# Patient Record
Sex: Female | Born: 1965 | Hispanic: Yes | Marital: Single | Smoking: Never smoker
Health system: Southern US, Community
[De-identification: ages and names within clinical notes are randomized; demographics above are authoritative.]

---

## 2005-03-29 ENCOUNTER — Ambulatory Visit: Payer: Self-pay

## 2008-04-28 ENCOUNTER — Ambulatory Visit: Payer: Self-pay

## 2009-03-22 ENCOUNTER — Ambulatory Visit: Payer: Self-pay | Admitting: Family Medicine

## 2009-03-29 ENCOUNTER — Ambulatory Visit: Payer: Self-pay | Admitting: Internal Medicine

## 2009-04-26 ENCOUNTER — Ambulatory Visit: Payer: Self-pay | Admitting: Family Medicine

## 2009-07-01 ENCOUNTER — Ambulatory Visit: Payer: Self-pay | Admitting: Family Medicine

## 2010-02-27 IMAGING — NM NUCLEAR MEDICINE HEPATOHBILIARY INCLUDE GB
2 series · 12 of 12 positions shown · non-contrast
Comparison: none

REASON FOR EXAM: nausea  vomiting   epigastric pain
COMMENTS:

[Series 1000: gallbladder dynamic (results) · 4.80mm/px · 6 of 60 frames shown]
[frame 6/60]
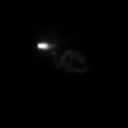
[frame 16/60]
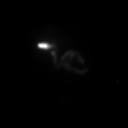
[frame 26/60]
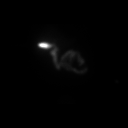
[frame 36/60]
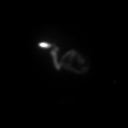
[frame 46/60]
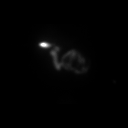
[frame 56/60]
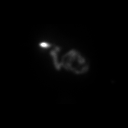

[Series 1000: gallbladder dynamic · 4.80mm/px · 6 of 60 frames shown]
[frame 6/60]
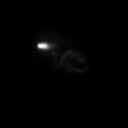
[frame 16/60]
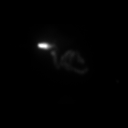
[frame 26/60]
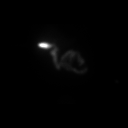
[frame 36/60]
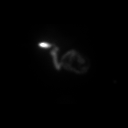
[frame 46/60]
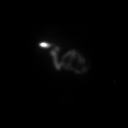
[frame 56/60]
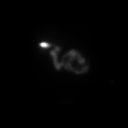

[12 of 12 positions shown; findings below may reference images not displayed]

PROCEDURE:     NM  - NM HEPATO WITH GB EJECT FRACTION  - March 29, 2009  [DATE]

RESULT:     Following intravenous administration of 8.2 mCi technetium 99m
Choletec, there is noted prompt visualization of tracer activity in the
liver at 3 minutes. At 50 minutes tracer activity is visualized in the
gallbladder, common duct and proximal small bowel.

The gallbladder ejection fraction at 30 minutes measures 60% which is within
the normal limits.
IMPRESSION: 1. Normal hepatobiliary scan.
2. The gallbladder ejection fraction measures 60% which is within the normal
range.

## 2010-11-01 ENCOUNTER — Ambulatory Visit: Payer: Self-pay

## 2010-11-08 ENCOUNTER — Ambulatory Visit: Payer: Self-pay

## 2011-05-15 ENCOUNTER — Ambulatory Visit: Payer: Self-pay

## 2011-11-15 ENCOUNTER — Ambulatory Visit: Payer: Self-pay

## 2013-01-14 ENCOUNTER — Ambulatory Visit: Payer: Self-pay | Admitting: Family Medicine

## 2015-03-31 ENCOUNTER — Ambulatory Visit
Admission: RE | Admit: 2015-03-31 | Discharge: 2015-03-31 | Disposition: A | Discharge status: Home / Self Care | Payer: Self-pay | Source: Ambulatory Visit | Attending: Oncology | Admitting: Oncology

## 2015-03-31 ENCOUNTER — Ambulatory Visit: Payer: Self-pay | Attending: Oncology

## 2015-03-31 VITALS — BP 115/77 | HR 87 | Temp 97.6°F | Resp 18 | Ht 61.42 in | Wt 194.7 lb

## 2015-03-31 DIAGNOSIS — Z0000 Encounter for general adult medical examination without abnormal findings: Secondary | ICD-10-CM

## 2015-03-31 NOTE — Progress Notes (Signed)
Subjective:     Patient ID: Anita Barnes, female   DOB: [birth year], 49 y.o.   MRN: [id number]  HPI   Review of Systems     Objective:   Physical Exam  Pulmonary/Chest: Right breast exhibits no inverted nipple, no mass, no nipple discharge, no skin change and no tenderness. Left breast exhibits no inverted nipple, no mass, no nipple discharge, no skin change and no tenderness. Breasts are symmetrical.  Tattoo upper left chest  Genitourinary: No labial fusion. There is no rash, tenderness, lesion or injury on the right labia. There is no rash, tenderness, lesion or injury on the left labia. Uterus is not deviated, not enlarged, not fixed and not tender. Cervix exhibits no motion tenderness, no discharge and no friability. Right adnexum displays no mass, no tenderness and no fullness. Left adnexum displays no mass, no tenderness and no fullness. No erythema, tenderness or bleeding in the vagina. No foreign body around the vagina. No signs of injury around the vagina. No vaginal discharge found.  Atrophic cervical os       Assessment:     49 year old hispanic patient presents for [facility] clinic visit.  Patient screened, and meets [facility] eligibility.  Patient does not have insurance, Medicare or Medicaid.  Handout given on Affordable Care Act.  CBE unremarkable.  Instructed patient on breast self-exam using teach back method    Plan:     Sent for bilateral screening mammogram.  Specimen collected for pap.  Jaqui Lakauitis interpreted exam.

## 2015-04-05 LAB — PAP LB AND HPV HIGH-RISK
HPV, high-risk: NEGATIVE
PAP SMEAR COMMENT: 0

## 2015-04-07 NOTE — Progress Notes (Signed)
Letter mailed to patient to notify of normal mammogram, and pap smear results.  Next pap smear due in 5 years due to negative HPV results.  Patient to return in one year for mammogram. Copy to [facility].

## 2016-06-07 ENCOUNTER — Ambulatory Visit
Admission: RE | Admit: 2016-06-07 | Discharge: 2016-06-07 | Disposition: A | Discharge status: Home / Self Care | Payer: Self-pay | Source: Ambulatory Visit | Attending: Oncology | Admitting: Oncology

## 2016-06-07 ENCOUNTER — Ambulatory Visit: Payer: Self-pay | Attending: Oncology

## 2016-06-07 VITALS — BP 111/76 | HR 76 | Temp 97.7°F | Ht 62.6 in | Wt 184.9 lb

## 2016-06-07 DIAGNOSIS — Z0000 Encounter for general adult medical examination without abnormal findings: Secondary | ICD-10-CM

## 2016-06-07 NOTE — Progress Notes (Signed)
Subjective:     Patient ID: Johnsie KindredRufina Sanchez Hernandez, female   DOB: 21-Jun-1966, 50 y.o.   MRN: [id number]  HPI   Review of Systems     Objective:   Physical Exam  Pulmonary/Chest: Right breast exhibits no inverted nipple, no mass, no nipple discharge, no skin change and no tenderness. Left breast exhibits no inverted nipple, no mass, no nipple discharge, no skin change and no tenderness. Breasts are symmetrical.  Tattoo left upper arm       Assessment:     50 year old hispanic [facility] Patient screened, and meets [facility] eligibility.  Patient does not have insurance, Medicare or Medicaid.  Handout given on Affordable Care Act.  Instructed patient on breast self-exam using teach back method.  CBE unremarkable.  No mass or lump palpated.  Jacqui Laukaitis interpreted exam. Patient had a negative /negative pap in 2016.    Plan:     Sent for bilateral screening mammogram.

## 2016-06-09 NOTE — Progress Notes (Signed)
Letter mailed from [facility] to notify of normal mammogram results.  Patient to return in one year for annual screening.  Copy to [facility]. 

## 2016-09-25 DIAGNOSIS — S82842A Displaced bimalleolar fracture of left lower leg, initial encounter for closed fracture: Secondary | ICD-10-CM | POA: Insufficient documentation

## 2017-08-15 ENCOUNTER — Encounter (INDEPENDENT_AMBULATORY_CARE_PROVIDER_SITE_OTHER): Payer: Self-pay

## 2017-08-15 ENCOUNTER — Other Ambulatory Visit: Payer: Self-pay

## 2017-08-15 ENCOUNTER — Ambulatory Visit: Payer: Self-pay | Attending: Oncology

## 2017-08-15 ENCOUNTER — Ambulatory Visit
Admission: RE | Admit: 2017-08-15 | Discharge: 2017-08-15 | Disposition: A | Discharge status: Home / Self Care | Payer: Self-pay | Source: Ambulatory Visit | Attending: Oncology | Admitting: Oncology

## 2017-08-15 VITALS — Ht 62.0 in | Wt 180.0 lb

## 2017-08-15 DIAGNOSIS — Z0000 Encounter for general adult medical examination without abnormal findings: Secondary | ICD-10-CM

## 2017-08-15 NOTE — Progress Notes (Signed)
Subjective:     Patient ID: Anita Barnes, female   DOB: [birth year], 52 y.o.   MRN: [id number]  HPI   Review of Systems     Objective:   Physical Exam  Pulmonary/Chest: Right breast exhibits no inverted nipple, no mass, no nipple discharge, no skin change and no tenderness. Left breast exhibits no inverted nipple, no mass, no nipple discharge, no skin change and no tenderness. Breasts are symmetrical.       Assessment:     52 year [facility] hispanic [facility]  Jaqui Laukaitis interpreted exam. Patient screened, and meets [facility] eligibility.  Patient does not have insurance, Medicare or Medicaid.  Handout given on Affordable Care Act. Instructed patient on breast self awareness using teach back method.  CBE unremarkable.  No mass or lump palpated.      Plan:     Sent for bilateral screening mammogram.

## 2017-08-16 NOTE — Progress Notes (Signed)
Letter mailed from [facility] to notify of normal mammogram results.  Patient to return in one year for annual screening.  Copy to [facility]. 

## 2022-08-23 ENCOUNTER — Other Ambulatory Visit: Payer: Self-pay

## 2022-08-23 DIAGNOSIS — Z1231 Encounter for screening mammogram for malignant neoplasm of breast: Secondary | ICD-10-CM

## 2022-09-25 ENCOUNTER — Ambulatory Visit: Payer: Self-pay | Attending: Hematology and Oncology | Admitting: Hematology and Oncology

## 2022-09-25 ENCOUNTER — Ambulatory Visit
Admission: RE | Admit: 2022-09-25 | Discharge: 2022-09-25 | Disposition: A | Discharge status: Home / Self Care | Payer: Self-pay | Source: Ambulatory Visit | Attending: Obstetrics and Gynecology | Admitting: Obstetrics and Gynecology

## 2022-09-25 VITALS — BP 132/85 | Wt 190.8 lb

## 2022-09-25 DIAGNOSIS — Z1231 Encounter for screening mammogram for malignant neoplasm of breast: Secondary | ICD-10-CM | POA: Insufficient documentation

## 2022-09-25 NOTE — Patient Instructions (Addendum)
Taught Anita Barnes about self breast awareness and gave educational materials to take home. Patient did not need a Pap smear today due to last Pap smear was in 2024 per patient.  Let her know [facility] will cover Pap smears every 5 years unless has a history of abnormal Pap smears. [facility]. Appointment scheduled for 09/25/22. Patient aware of appointment and will be there. Let patient know will follow up with her within the next couple weeks with results. Anita Barnes verbalized understanding.  Melodye Ped, NP 2:30 PM

## 2022-09-25 NOTE — Progress Notes (Signed)
Ms. Carly Roady is a 57 y.o. female who presents to [facility] clinic today with no complaints.    Pap Smear: Pap not smear completed today. Last Pap smear was 2024 at [facility] clinic and was normal. Per patient has no history of an abnormal Pap smear. Last Pap smear result is available in Epic.   Physical exam: Breasts Breasts symmetrical. No skin abnormalities bilateral breasts. No nipple retraction bilateral breasts. No nipple discharge bilateral breasts. No lymphadenopathy. No lumps palpated bilateral breasts.        Pelvic/Bimanual Pap is not indicated today    Smoking History: Patient has never smoked and was not referred to quit line.    Patient Navigation: Patient education provided. Access to services provided for patient through [place] interpreter provided. No transportation provided   Colorectal Cancer Screening: Per patient has never had colonoscopy completed No complaints today. FIT test per Princella Ion.   Breast and Cervical Cancer Risk Assessment: Patient does not have family history of breast cancer, known genetic mutations, or radiation treatment to the chest before age 8. Patient does not have history of cervical dysplasia, immunocompromised, or DES exposure in-utero.  Risk Scores as of 09/25/2022     Baker Janus           5-year 0.81 %   Lifetime 5.35 %   This patient is [place]/Lat[place] but has no documented birth country, so the [place] used data from [place] patients to calculate their risk score. Document a birth country in the Demographics activity for a more accurate score.         Last calculated by Demetrius Revel, LPN on [id number] at  2:40 PM        A: [facility] exam without pap smear No complaints with benign exam.   P: Referred patient to the [facility] for a screening mammogram. Appointment scheduled 09/25/22.  Melodye Ped, NP 09/25/2022 2:46 PM

## 2023-07-16 ENCOUNTER — Ambulatory Visit
Admission: EM | Admit: 2023-07-16 | Discharge: 2023-07-16 | Disposition: A | Discharge status: Home / Self Care | Payer: Self-pay | Attending: Physician Assistant | Admitting: Physician Assistant

## 2023-07-16 ENCOUNTER — Ambulatory Visit: Payer: Self-pay

## 2023-07-16 ENCOUNTER — Other Ambulatory Visit: Payer: Self-pay

## 2023-07-16 DIAGNOSIS — R030 Elevated blood-pressure reading, without diagnosis of hypertension: Secondary | ICD-10-CM

## 2023-07-16 DIAGNOSIS — R0789 Other chest pain: Secondary | ICD-10-CM

## 2023-07-16 MED ORDER — NAPROXEN 500 MG PO TABS: 500.0000 mg | ORAL_TABLET | Freq: Two times a day (BID) | ORAL | 0 refills | Status: AC | PRN

## 2023-07-16 MED ORDER — BACLOFEN 10 MG PO TABS: 10.0000 mg | ORAL_TABLET | Freq: Three times a day (TID) | ORAL | 0 refills | Status: AC | PRN

## 2023-07-16 NOTE — ED Triage Notes (Signed)
Pt c/o L sided chest pressure x1 mon. Denies any cardiac hx or SOB.

## 2023-07-16 NOTE — ED Provider Notes (Signed)
[facility]    CSN: [id number] Arrival date & time: 07/16/23  1241      History   Chief Complaint Chief Complaint  Patient presents with   Chest Pressure    HPI Anita Barnes is a 57 y.o. female presenting with her family member for centralized chest pressure/tightness off and on for a month.  Nothing seems to make the pain worse other than touching her chest.  She denies associated fever, cough, congestion, wheezing, shortness of breath, palpitations, dizziness or weakness.  No injury.  She is worried about her heart.  She has a primary care provider but says it takes a long time to get into see them.  She does not take any routine meds.  She has taken Tylenol and says that takes the pain away.  She is experiencing some mild discomfort at this time.  No other complaints.  Language interpreter service used to communicate with patient.  HPI  History reviewed. No pertinent past medical history.  Patient Active Problem List   Diagnosis Date Noted   Closed bimalleolar fracture of left ankle 09/25/2016    History reviewed. No pertinent surgical history.  OB History   No obstetric history on file.      Home Medications    Prior to Admission medications   Medication Sig Start Date End Date Taking? Authorizing Provider  baclofen (LIORESAL) 10 MG tablet Take 1 tablet (10 mg total) by mouth 3 (three) times daily as needed for muscle spasms. 07/16/23  Yes Eusebio Friendly B, PA-C  naproxen (NAPROSYN) 500 MG tablet Take 1 tablet (500 mg total) by mouth 2 (two) times daily as needed for moderate pain (pain score 4-6). 07/16/23  Yes Shirlee Latch, PA-C    Family History Family History  Problem Relation Age of Onset   Thyroid disease Mother    Breast cancer Neg Hx     Social History Social History   Tobacco Use   Smoking status: Never   Smokeless tobacco: Never  Vaping Use   Vaping status: Never Used  Substance Use Topics   Alcohol use: Not  Currently   Drug use: Never     Allergies   Patient has no known allergies.   Review of Systems Review of Systems  Constitutional:  Negative for fatigue and fever.  HENT:  Negative for congestion.   Respiratory:  Positive for chest tightness. Negative for cough, shortness of breath and wheezing.   Cardiovascular:  Positive for chest pain. Negative for palpitations and leg swelling.  Gastrointestinal:  Negative for abdominal pain, nausea and vomiting.  Musculoskeletal:  Negative for back pain.  Neurological:  Negative for dizziness, weakness and headaches.     Physical Exam Triage Vital Signs ED Triage Vitals  Encounter Vitals Group     BP 07/16/23 1331 (!) 163/98     Systolic BP Percentile --      Diastolic BP Percentile --      Pulse Rate 07/16/23 1331 70     Resp 07/16/23 1331 16     Temp 07/16/23 1331 98.1 F (36.7 C)     Temp Source 07/16/23 1331 Oral     SpO2 07/16/23 1331 94 %     Weight 07/16/23 1331 188 lb (85.3 kg)     Height 07/16/23 1331 5\' 3"  (1.6 m)     Head Circumference --      Peak Flow --      Pain Score 07/16/23 1337 0  Pain Loc --      Pain Education --      Exclude from Growth Chart --    No data found.  Updated Vital Signs BP (!) 163/98 (BP Location: Left Arm)   Pulse 70   Temp 98.1 F (36.7 C) (Oral)   Resp 16   Ht 5\' 3"  (1.6 m)   Wt 188 lb (85.3 kg)   LMP 06/07/2014 (Approximate)   SpO2 94%   BMI 33.30 kg/m      Physical Exam Vitals and nursing note reviewed.  Constitutional:      General: She is not in acute distress.    Appearance: Normal appearance. She is obese. She is not ill-appearing or toxic-appearing.  HENT:     Head: Normocephalic and atraumatic.     Nose: Nose normal.     Mouth/Throat:     Mouth: Mucous membranes are moist.     Pharynx: Oropharynx is clear.  Eyes:     General: No scleral icterus.       Right eye: No discharge.        Left eye: No discharge.     Conjunctiva/sclera: Conjunctivae normal.   Cardiovascular:     Rate and Rhythm: Normal rate and regular rhythm.     Heart sounds: Normal heart sounds.  Pulmonary:     Effort: Pulmonary effort is normal. No respiratory distress.     Breath sounds: Normal breath sounds.  Chest:     Chest wall: Tenderness (left chest diffusely, left trapezius and upper left thoracic back) present.  Abdominal:     Palpations: Abdomen is soft.     Tenderness: There is no abdominal tenderness.  Musculoskeletal:     Cervical back: Neck supple.  Skin:    General: Skin is dry.  Neurological:     General: No focal deficit present.     Mental Status: She is alert. Mental status is at baseline.     Motor: No weakness.     Gait: Gait normal.  Psychiatric:        Mood and Affect: Mood normal.        Behavior: Behavior normal.      UC Treatments / Results  Labs (all labs ordered are listed, but only abnormal results are displayed) Labs Reviewed - No data to display  EKG   Radiology DG Chest 2 View Result Date: 07/16/2023 CLINICAL DATA:  Chest pressure EXAM: CHEST - 2 VIEW COMPARISON:  None Available. FINDINGS: The heart size and mediastinal contours are within normal limits. Both lungs are clear. The visualized skeletal structures are unremarkable. IMPRESSION: No active cardiopulmonary disease. Electronically Signed   By: Duanne Guess D.O.   On: 07/16/2023 15:19    Procedures ED EKG  Date/Time: 07/16/2023 2:55 PM  Performed by: Shirlee Latch, PA-C Authorized by: Shirlee Latch, PA-C   Previous ECG:    Previous ECG:  Unavailable Interpretation:    Interpretation: normal   Rate:    ECG rate:  72   ECG rate assessment: normal   Rhythm:    Rhythm: sinus rhythm   Ectopy:    Ectopy: none   QRS:    QRS axis:  Normal   QRS intervals:  Normal   QRS conduction: normal   ST segments:    ST segments:  Normal T waves:    T waves: normal   Comments:     Normal sinus rhythm. Regular rate.   (including critical care  time)  Medications Ordered  in UC Medications - No data to display  Initial Impression / Assessment and Plan / UC Course  I have reviewed the triage vital signs and the nursing notes.  Pertinent labs & imaging results that were available during my care of the patient were reviewed by me and considered in my medical decision making (see chart for details).   57 year old female presents for central/left-sided chest pressure/tightness intermittently x 1 month.  Denies any other associated symptoms.  Pain goes away with Tylenol.  Chest pain is an acute complicated complaint especially in the setting of elevated blood pressure.  Increased risk for acute coronary syndrome.  Blood pressure 163/98.  She denies history of hypertension.  Does not have any upcoming appointments with primary care provider.  Is established with one.  On exam has tenderness of left chest wall, left trapezius and left shoulder.  Chest clear.  Heart regular rate and rhythm.  EKG performed shows normal sinus rhythm and regular rate.  Chest x-ray negative.  Musculoskeletal chest wall pain.  Supportive care encouraged.  Sent naproxen and baclofen to pharmacy.  Encouraged stretching, heat, ice, muscle rubs.  Reviewed return and ER precautions.  Advised to follow-up with PCP regarding elevated blood pressure reading.  Reviewed ED precautions for chest pain.   Final Clinical Impressions(s) / UC Diagnoses   Final diagnoses:  Chest wall pain  Elevated blood pressure reading     Discharge Instructions      -El electrocardiograma y la radiografa de trax fueron tranquilizadores. -Envi medicamentos a la farmacia para el dolor/inflamacin y la distensin muscular. -Utilizar tambin calor y hielo, masajes musculares. -Hacer un seguimiento con el proveedor de atencin primaria si la presin arterial es alta. Laurena Bering a urgencias si el dolor en el pecho empeora, tiene palpitaciones o dificultad para respirar.  -EKG and chest  x-ray were reassuring -I sent medicine to pharmacy for pain/inflammation and muscle strain -Also use heat and ice, muscle rubs -Follow up with primary care provider about blood pressure being high -Go to ER if chest pain worsens of you have racing heart or shortness of breath.     ED Prescriptions     Medication Sig Dispense Auth. Provider   baclofen (LIORESAL) 10 MG tablet Take 1 tablet (10 mg total) by mouth 3 (three) times daily as needed for muscle spasms. 30 each Shirlee Latch, PA-C   naproxen (NAPROSYN) 500 MG tablet Take 1 tablet (500 mg total) by mouth 2 (two) times daily as needed for moderate pain (pain score 4-6). 30 tablet Gareth Morgan      PDMP not reviewed this encounter.   Shirlee Latch, PA-C 07/16/23 1525

## 2023-07-16 NOTE — Discharge Instructions (Addendum)
-  El electrocardiograma y la radiografa de trax fueron tranquilizadores. -Envi medicamentos a la farmacia para el dolor/inflamacin y la distensin muscular. -Utilizar tambin calor y hielo, masajes musculares. -Hacer un seguimiento con el proveedor de atencin primaria si la presin arterial es alta. Laurena Bering a urgencias si el dolor en el pecho empeora, tiene palpitaciones o dificultad para respirar.  -EKG and chest x-ray were reassuring -I sent medicine to pharmacy for pain/inflammation and muscle strain -Also use heat and ice, muscle rubs -Follow up with primary care provider about blood pressure being high -Go to ER if chest pain worsens of you have racing heart or shortness of breath.

## 2023-09-04 ENCOUNTER — Encounter: Payer: Self-pay | Admitting: Physician Assistant

## 2023-10-09 ENCOUNTER — Other Ambulatory Visit: Payer: Self-pay

## 2023-10-09 DIAGNOSIS — Z1231 Encounter for screening mammogram for malignant neoplasm of breast: Secondary | ICD-10-CM

## 2023-10-22 ENCOUNTER — Ambulatory Visit
Admission: RE | Admit: 2023-10-22 | Discharge: 2023-10-22 | Disposition: A | Discharge status: Home / Self Care | Payer: Self-pay | Source: Ambulatory Visit | Attending: Obstetrics and Gynecology | Admitting: Obstetrics and Gynecology

## 2023-10-22 ENCOUNTER — Ambulatory Visit: Payer: Self-pay | Attending: Hematology and Oncology | Admitting: Hematology and Oncology

## 2023-10-22 VITALS — BP 148/101 | Wt 191.8 lb

## 2023-10-22 DIAGNOSIS — Z1231 Encounter for screening mammogram for malignant neoplasm of breast: Secondary | ICD-10-CM

## 2023-10-22 NOTE — Patient Instructions (Signed)
 Taught Anita Barnes about self breast awareness and gave educational materials to take home. Patient did not need a Pap smear today due to last Pap smear was in 2023 per patient. Told patient about free cervical cancer screenings to receive a Pap smear if would like one next year. Let her know [facility] will cover Pap smears every 5 years unless has a history of abnormal Pap smears. [facility] to [facility] for diagnostic mammogram. Appointment scheduled for 10/22/2023. Patient aware of appointment and will be there. Let patient know will follow up with her within the next couple weeks with results. Anita Barnes verbalized understanding.  Pascal Lux, NP 2:55 PM

## 2023-10-22 NOTE — Progress Notes (Signed)
 Ms. Laia Wiley is a 58 y.o. female who presents to [facility] clinic today with no complaints.    Pap Smear: Pap not smear completed today. Last Pap smear was 06/30/2022 at [facility] clinic and was normal. Per patient has no history of an abnormal Pap smear. Last Pap smear result is available in Epic.   Physical exam: Breasts Breasts symmetrical. No skin abnormalities bilateral breasts. No nipple retraction bilateral breasts. No nipple discharge bilateral breasts. No lymphadenopathy. No lumps palpated bilateral breasts.       Pelvic/Bimanual Pap is not indicated today    Smoking History: Patient has never smoked and was not referred to quit line.    Patient Navigation: Patient education provided. Access to services provided for patient through [facility] program. [place] interpreter provided. No transportation provided   Colorectal Cancer Screening: Per patient has never had colonoscopy completed No complaints today.    Breast and Cervical Cancer Risk Assessment: Patient does not have family history of breast cancer, known genetic mutations, or radiation treatment to the chest before age 1. Patient does not have history of cervical dysplasia, immunocompromised, or DES exposure in-utero.  Risk Scores as of Encounter on 10/22/2023     Dondra Spry as of 09/25/2022           5-year 0.81%   Lifetime 5.35%   This patient is [place] but has no documented birth country, so the [place] model used data from [place] patients to calculate their risk score. Document a birth country in the Demographics activity for a more accurate score.         Last calculated by Narda Rutherford, LPN on 1/61/0960 at  2:40 PM        A: [facility] exam without pap smear No complaints with benign exam.   P: Referred patient to the [facility] [place] for a screening mammogram. Appointment scheduled 10/22/2023.  Pascal Lux, NP 10/22/2023 2:55 PM

## 2024-03-05 ENCOUNTER — Ambulatory Visit
Admission: RE | Admit: 2024-03-05 | Discharge: 2024-03-05 | Disposition: A | Discharge status: Home / Self Care | Payer: Worker's Compensation | Source: Ambulatory Visit | Attending: Physician Assistant | Admitting: Physician Assistant

## 2024-03-05 ENCOUNTER — Other Ambulatory Visit: Payer: Self-pay | Admitting: Physician Assistant

## 2024-03-05 DIAGNOSIS — R0781 Pleurodynia: Secondary | ICD-10-CM | POA: Diagnosis present

## 2024-03-05 DIAGNOSIS — Y990 Civilian activity done for income or pay: Secondary | ICD-10-CM | POA: Diagnosis not present

## 2024-03-12 ENCOUNTER — Other Ambulatory Visit: Payer: Self-pay

## 2024-03-12 MED ORDER — NAPROXEN 500 MG PO TABS
500.0000 mg | ORAL_TABLET | Freq: Two times a day (BID) | ORAL | 0 refills | Status: AC
  Filled 2024-03-12 (×2): qty 26, 13d supply, fill #0
